# Patient Record
Sex: Female | Born: 1960 | Race: White | Hispanic: No | Marital: Married | Smoking: Never smoker
Health system: Southern US, Community
[De-identification: ages and names within clinical notes are randomized; demographics above are authoritative.]

## PROBLEM LIST (undated history)

## (undated) DIAGNOSIS — I10 Essential (primary) hypertension: Secondary | ICD-10-CM

## (undated) DIAGNOSIS — E7800 Pure hypercholesterolemia, unspecified: Secondary | ICD-10-CM

## (undated) DIAGNOSIS — K219 Gastro-esophageal reflux disease without esophagitis: Secondary | ICD-10-CM

## (undated) DIAGNOSIS — G43909 Migraine, unspecified, not intractable, without status migrainosus: Secondary | ICD-10-CM

## (undated) DIAGNOSIS — K760 Fatty (change of) liver, not elsewhere classified: Secondary | ICD-10-CM

## (undated) HISTORY — PX: BREAST BIOPSY: SHX20

## (undated) HISTORY — PX: HEMORRHOID SURGERY: SHX153

## (undated) HISTORY — PX: ENDOMETRIAL BIOPSY: SHX622

## (undated) HISTORY — PX: CERVICAL BIOPSY  W/ LOOP ELECTRODE EXCISION: SUR135

---

## 2000-01-17 ENCOUNTER — Encounter: Payer: Self-pay | Admitting: Obstetrics and Gynecology

## 2000-01-17 ENCOUNTER — Encounter: Admission: RE | Admit: 2000-01-17 | Discharge: 2000-01-17 | Payer: Self-pay | Admitting: Obstetrics and Gynecology

## 2000-02-02 ENCOUNTER — Encounter: Payer: Self-pay | Admitting: Obstetrics and Gynecology

## 2000-02-02 ENCOUNTER — Encounter: Admission: RE | Admit: 2000-02-02 | Discharge: 2000-02-02 | Payer: Self-pay | Admitting: Obstetrics and Gynecology

## 2000-03-01 ENCOUNTER — Ambulatory Visit (HOSPITAL_COMMUNITY): Admission: RE | Admit: 2000-03-01 | Discharge: 2000-03-01 | Payer: Self-pay | Admitting: Surgery

## 2000-03-01 ENCOUNTER — Encounter (INDEPENDENT_AMBULATORY_CARE_PROVIDER_SITE_OTHER): Payer: Self-pay

## 2000-03-01 ENCOUNTER — Encounter: Payer: Self-pay | Admitting: Surgery

## 2000-08-27 ENCOUNTER — Encounter: Admission: RE | Admit: 2000-08-27 | Discharge: 2000-08-27 | Payer: Self-pay | Admitting: Surgery

## 2000-08-27 ENCOUNTER — Encounter: Payer: Self-pay | Admitting: Surgery

## 2001-01-21 ENCOUNTER — Encounter: Payer: Self-pay | Admitting: Surgery

## 2001-01-21 ENCOUNTER — Encounter: Admission: RE | Admit: 2001-01-21 | Discharge: 2001-01-21 | Payer: Self-pay | Admitting: Surgery

## 2002-01-26 ENCOUNTER — Encounter: Admission: RE | Admit: 2002-01-26 | Discharge: 2002-01-26 | Payer: Self-pay | Admitting: Obstetrics and Gynecology

## 2002-01-26 ENCOUNTER — Encounter: Payer: Self-pay | Admitting: Obstetrics and Gynecology

## 2003-01-28 ENCOUNTER — Encounter: Admission: RE | Admit: 2003-01-28 | Discharge: 2003-01-28 | Payer: Self-pay | Admitting: Family Medicine

## 2003-01-28 ENCOUNTER — Encounter: Payer: Self-pay | Admitting: Family Medicine

## 2004-03-14 ENCOUNTER — Encounter: Admission: RE | Admit: 2004-03-14 | Discharge: 2004-03-14 | Payer: Self-pay | Admitting: Obstetrics and Gynecology

## 2004-05-26 ENCOUNTER — Encounter (INDEPENDENT_AMBULATORY_CARE_PROVIDER_SITE_OTHER): Payer: Self-pay | Admitting: Specialist

## 2004-05-26 ENCOUNTER — Ambulatory Visit (HOSPITAL_COMMUNITY): Admission: RE | Admit: 2004-05-26 | Discharge: 2004-05-26 | Payer: Self-pay | Admitting: Obstetrics and Gynecology

## 2005-04-30 ENCOUNTER — Encounter: Admission: RE | Admit: 2005-04-30 | Discharge: 2005-04-30 | Payer: Self-pay | Admitting: Obstetrics and Gynecology

## 2005-06-18 ENCOUNTER — Ambulatory Visit: Payer: Self-pay | Admitting: Family Medicine

## 2005-08-27 ENCOUNTER — Ambulatory Visit: Payer: Self-pay | Admitting: Family Medicine

## 2006-06-03 ENCOUNTER — Ambulatory Visit: Payer: Self-pay | Admitting: Family Medicine

## 2006-06-05 ENCOUNTER — Encounter: Admission: RE | Admit: 2006-06-05 | Discharge: 2006-06-05 | Payer: Self-pay | Admitting: Obstetrics and Gynecology

## 2007-01-23 ENCOUNTER — Ambulatory Visit: Payer: Self-pay | Admitting: Family Medicine

## 2007-01-30 ENCOUNTER — Ambulatory Visit: Payer: Self-pay | Admitting: Family Medicine

## 2007-04-14 ENCOUNTER — Ambulatory Visit (HOSPITAL_COMMUNITY): Admission: RE | Admit: 2007-04-14 | Discharge: 2007-04-14 | Payer: Self-pay | Admitting: Obstetrics and Gynecology

## 2007-06-30 ENCOUNTER — Encounter: Admission: RE | Admit: 2007-06-30 | Discharge: 2007-06-30 | Payer: Self-pay | Admitting: Obstetrics and Gynecology

## 2008-02-27 IMAGING — US US TRANSVAGINAL NON-OB
1 series · 14 of 25 positions shown · non-contrast
Comparison: none

CLINICAL DATA: Abnormal uterine bleeding.  
 TRANSABDOMINAL AND TRANSVAGINAL PELVIC ULTRASOUND:
TECHNIQUE: Both transabdominal and transvaginal ultrasound examinations of the pelvis were performed including evaluation of the uterus, ovaries, adnexal regions, and pelvic cul-de-sac.

[Series 1: us transvaginal non-ob · 0.29mm/px · 14 of 48 slices shown]
[im 1/48]
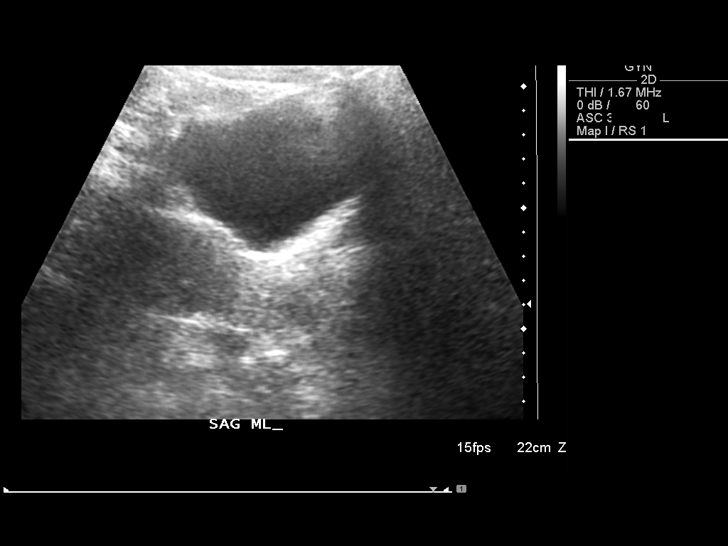
[im 4/48]
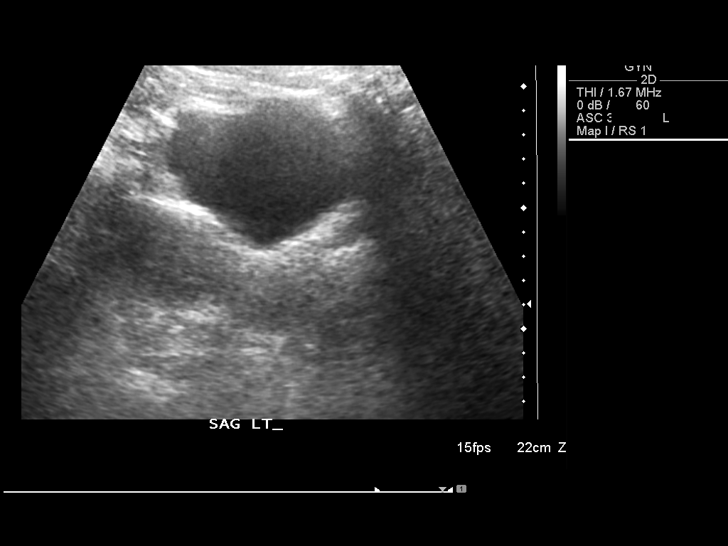
[im 8/48]
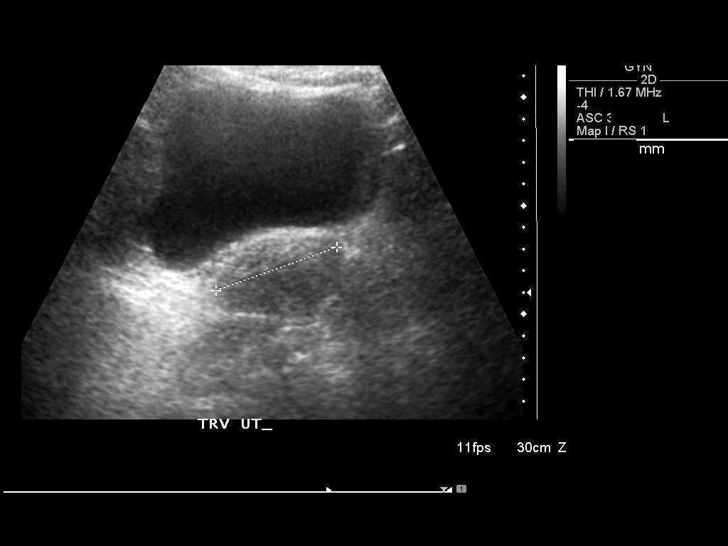
[im 12/48]
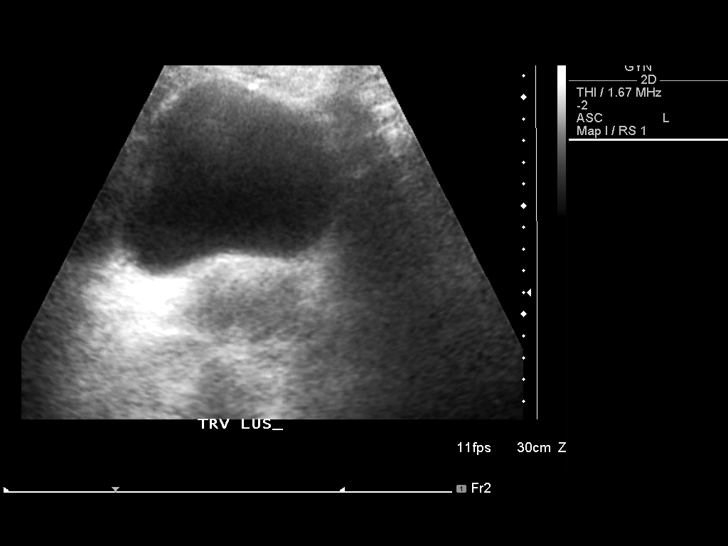
[im 16/48]
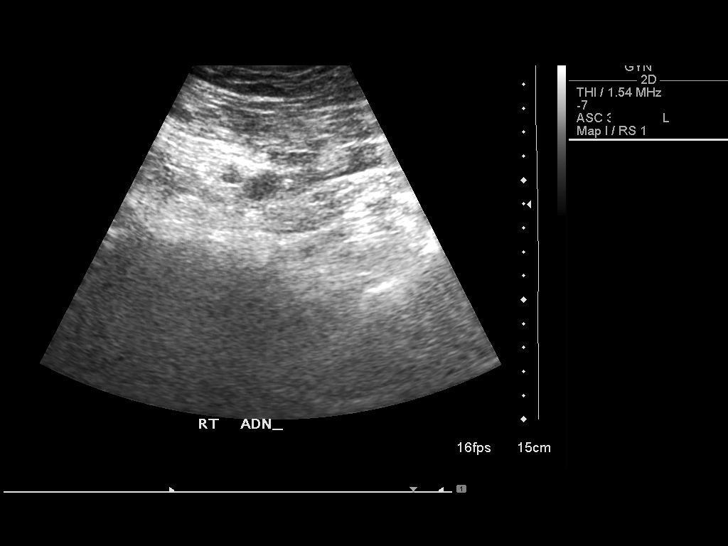
[im 18/48]
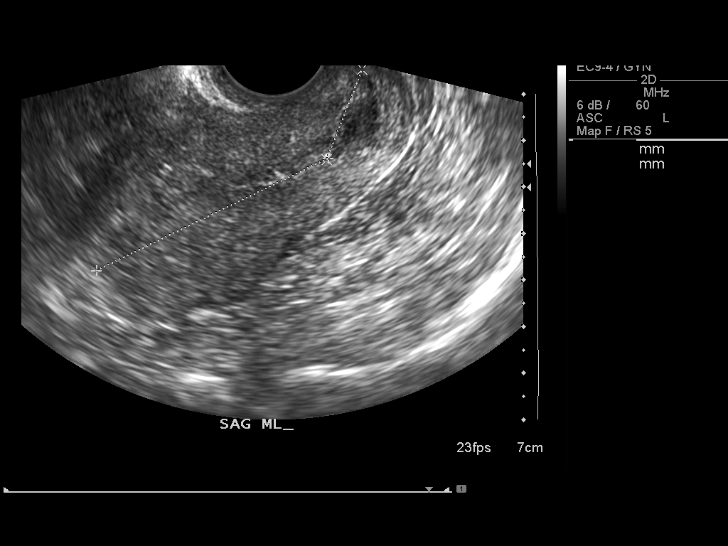
[im 22/48]
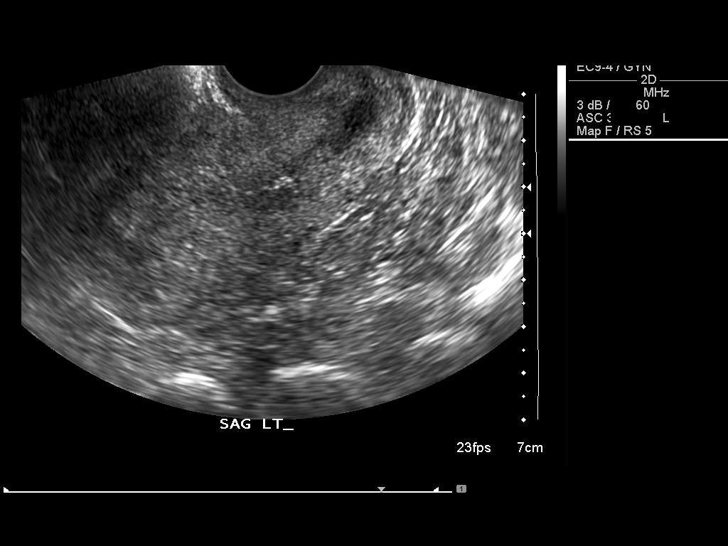
[im 26/48]
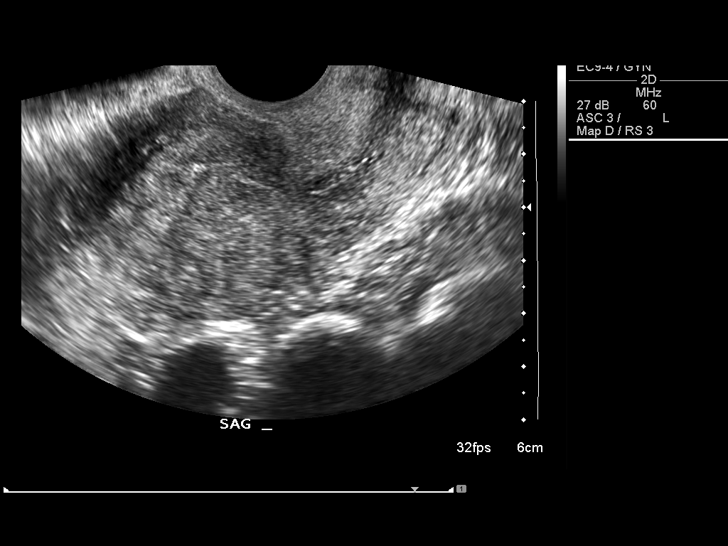
[im 30/48]
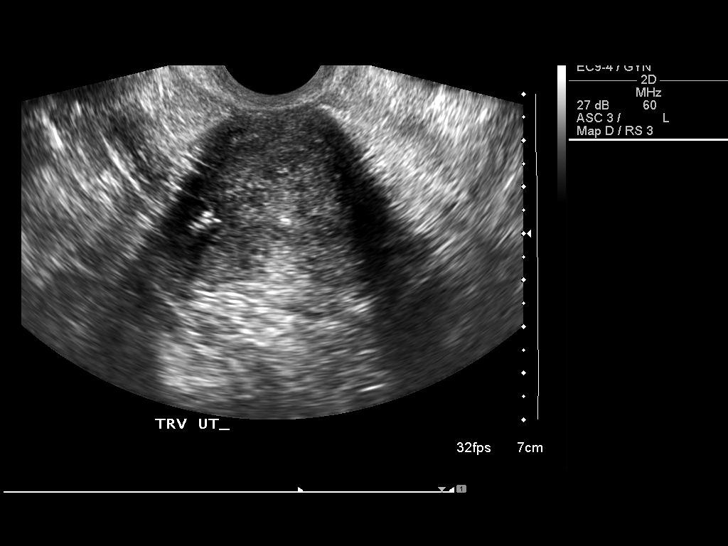
[im 32/48]
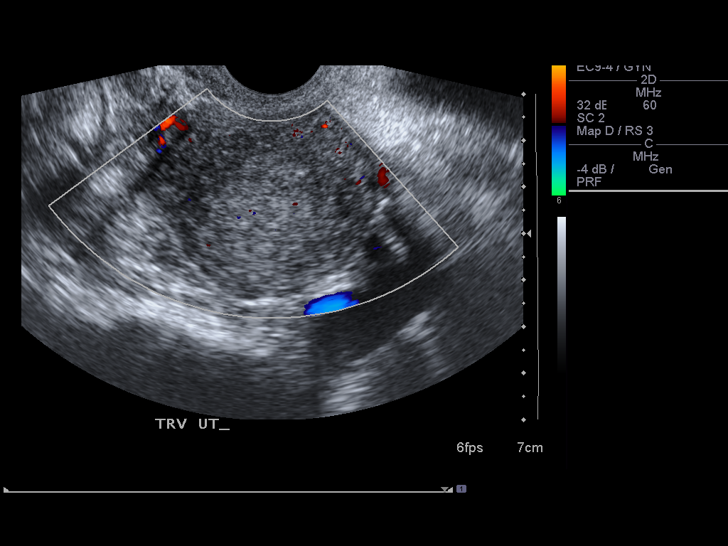
[im 36/48]
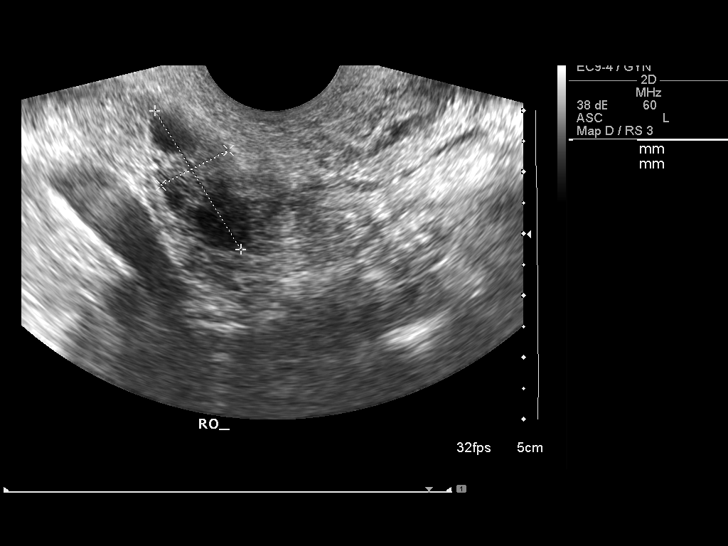
[im 40/48]
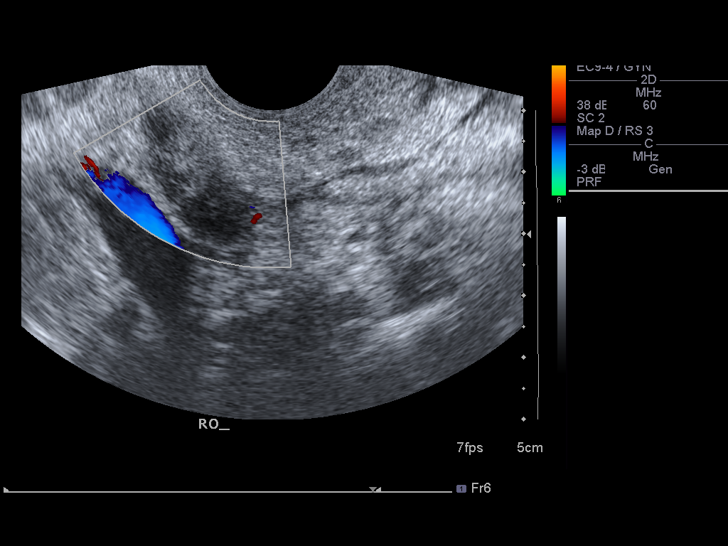
[im 44/48]
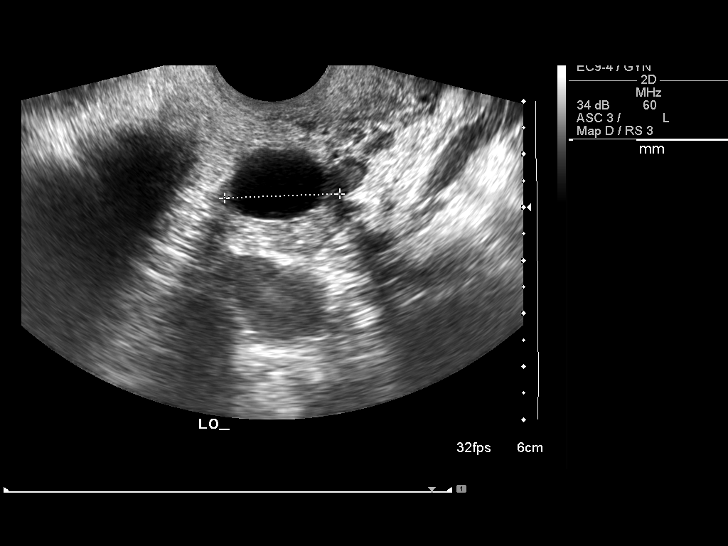
[im 48/48]
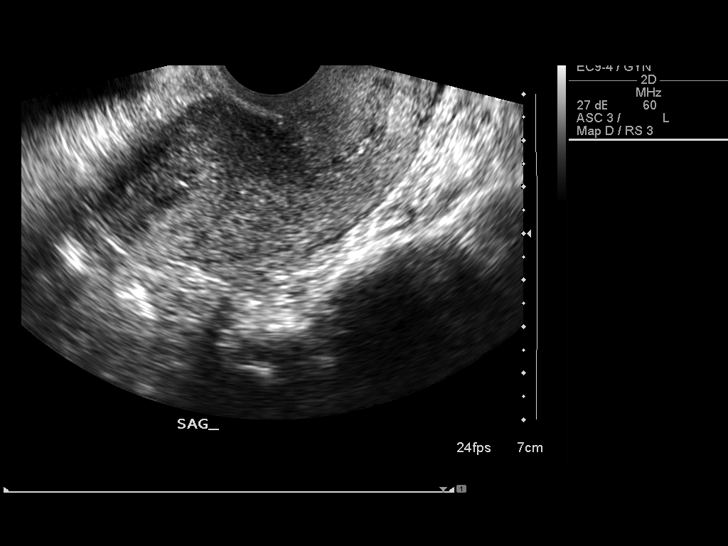

[14 of 25 positions shown; findings below may reference images not displayed]

FINDINGS: The uterus is normal in size measuring approximately 7.5 cm in length.  The uterine myometrium is somewhat inhomogeneous.  A single discrete subserosal myometrial mass is seen in the posterior uterine fundus which measures 1.1 cm in greatest diameter.  Endometrial stripe is unremarkable in appearance and measures 5 mm in thickness on transvaginal sonography. 
 Both ovaries are normal in appearance.  No adnexal masses are identified by transabdominal or transvaginal sonography. There is no evidence of free fluid.
IMPRESSION: 1.  Single tiny subserosal fibroid in the posterior uterine fundus measuring 1.1 cm. 
 2.   Normal ovaries.

## 2008-09-02 ENCOUNTER — Encounter: Admission: RE | Admit: 2008-09-02 | Discharge: 2008-09-02 | Payer: Self-pay | Admitting: Obstetrics and Gynecology

## 2009-11-09 ENCOUNTER — Encounter: Admission: RE | Admit: 2009-11-09 | Discharge: 2009-11-09 | Payer: Self-pay | Admitting: Obstetrics and Gynecology

## 2010-12-07 ENCOUNTER — Encounter
Admission: RE | Admit: 2010-12-07 | Discharge: 2010-12-07 | Payer: Self-pay | Source: Home / Self Care | Attending: Unknown Physician Specialty | Admitting: Unknown Physician Specialty

## 2011-05-04 NOTE — Op Note (Signed)
NAME:  Natalie Garner, Natalie Garner                      ACCOUNT NO.:  [id number]   MEDICAL RECORD NO.:  [id number]                   PATIENT TYPE:  AMB   LOCATION:  DAY                                  FACILITY:  [facility]   PHYSICIAN:  Katherine Roan, M.D.               DATE OF BIRTH:  31-Aug-1961   DATE OF PROCEDURE:  05/26/2004  DATE OF DISCHARGE:                                 OPERATIVE REPORT   PREOPERATIVE DIAGNOSES:  Rule out endocervical adenocarcinoma in situ.   POSTOPERATIVE DIAGNOSES:  Rule out endocervical adenocarcinoma in situ.   OPERATION:  Pelvic exam under anesthesia, cold knife conization,  endocervical curettage.   DESCRIPTION OF PROCEDURE:  The patient was placed in lithotomy position,  examination revealed a parous introitus with a normal size cervix and  uterus.  Prepped and draped in routine fashion, bladder was emptied.  The  cervix was a single tooth tenaculum and injected with 20 mL of 1% Xylocaine  with epinephrine. Following this, I put two lateral hemostatic sutures in  the cervical os and then performed a cold knife conization. An excellent  conization was obtained. The lower segment was then curetted and sent  separately with Telfa. I then used the ball cautery to cauterize the cervix,  hemostasis was secure, no unusual blood loss occurred. Arline Asp was sent to the  recovery room in good condition.                                               Katherine Roan, M.D.    SDM/MEDQ  D:  05/26/2004  T:  05/26/2004  Job:  [id number]

## 2011-11-14 ENCOUNTER — Other Ambulatory Visit: Payer: Self-pay | Admitting: Unknown Physician Specialty

## 2011-11-14 DIAGNOSIS — Z1231 Encounter for screening mammogram for malignant neoplasm of breast: Secondary | ICD-10-CM

## 2011-12-13 ENCOUNTER — Ambulatory Visit
Admission: RE | Admit: 2011-12-13 | Discharge: 2011-12-13 | Disposition: A | Discharge status: Home / Self Care | Payer: BC Managed Care – PPO | Source: Ambulatory Visit | Attending: Unknown Physician Specialty | Admitting: Unknown Physician Specialty

## 2011-12-13 DIAGNOSIS — Z1231 Encounter for screening mammogram for malignant neoplasm of breast: Secondary | ICD-10-CM

## 2012-11-25 ENCOUNTER — Other Ambulatory Visit: Payer: Self-pay | Admitting: Unknown Physician Specialty

## 2012-11-25 DIAGNOSIS — Z1231 Encounter for screening mammogram for malignant neoplasm of breast: Secondary | ICD-10-CM

## 2013-01-02 ENCOUNTER — Ambulatory Visit: Payer: BC Managed Care – PPO

## 2013-01-06 ENCOUNTER — Ambulatory Visit
Admission: RE | Admit: 2013-01-06 | Discharge: 2013-01-06 | Disposition: A | Discharge status: Home / Self Care | Payer: BC Managed Care – PPO | Source: Ambulatory Visit | Attending: Unknown Physician Specialty | Admitting: Unknown Physician Specialty

## 2013-01-06 DIAGNOSIS — Z1231 Encounter for screening mammogram for malignant neoplasm of breast: Secondary | ICD-10-CM

## 2015-01-21 ENCOUNTER — Ambulatory Visit: Payer: Self-pay | Admitting: Physician Assistant

## 2015-03-15 ENCOUNTER — Ambulatory Visit: Payer: Self-pay | Admitting: Emergency Medicine

## 2015-03-15 LAB — PREGNANCY, URINE: Pregnancy Test, Urine: NEGATIVE m[IU]/mL

## 2016-01-28 IMAGING — CR RIGHT HIP - COMPLETE 2+ VIEW
3 series · 3 of 3 positions shown · non-contrast
Comparison: None.

CLINICAL DATA: Injured 2 days ago moving furniture right hip pain
radiating to the right knee

EXAM:
RIGHT HIP (WITH PELVIS) 2-3 VIEWS

[pelvis ap]
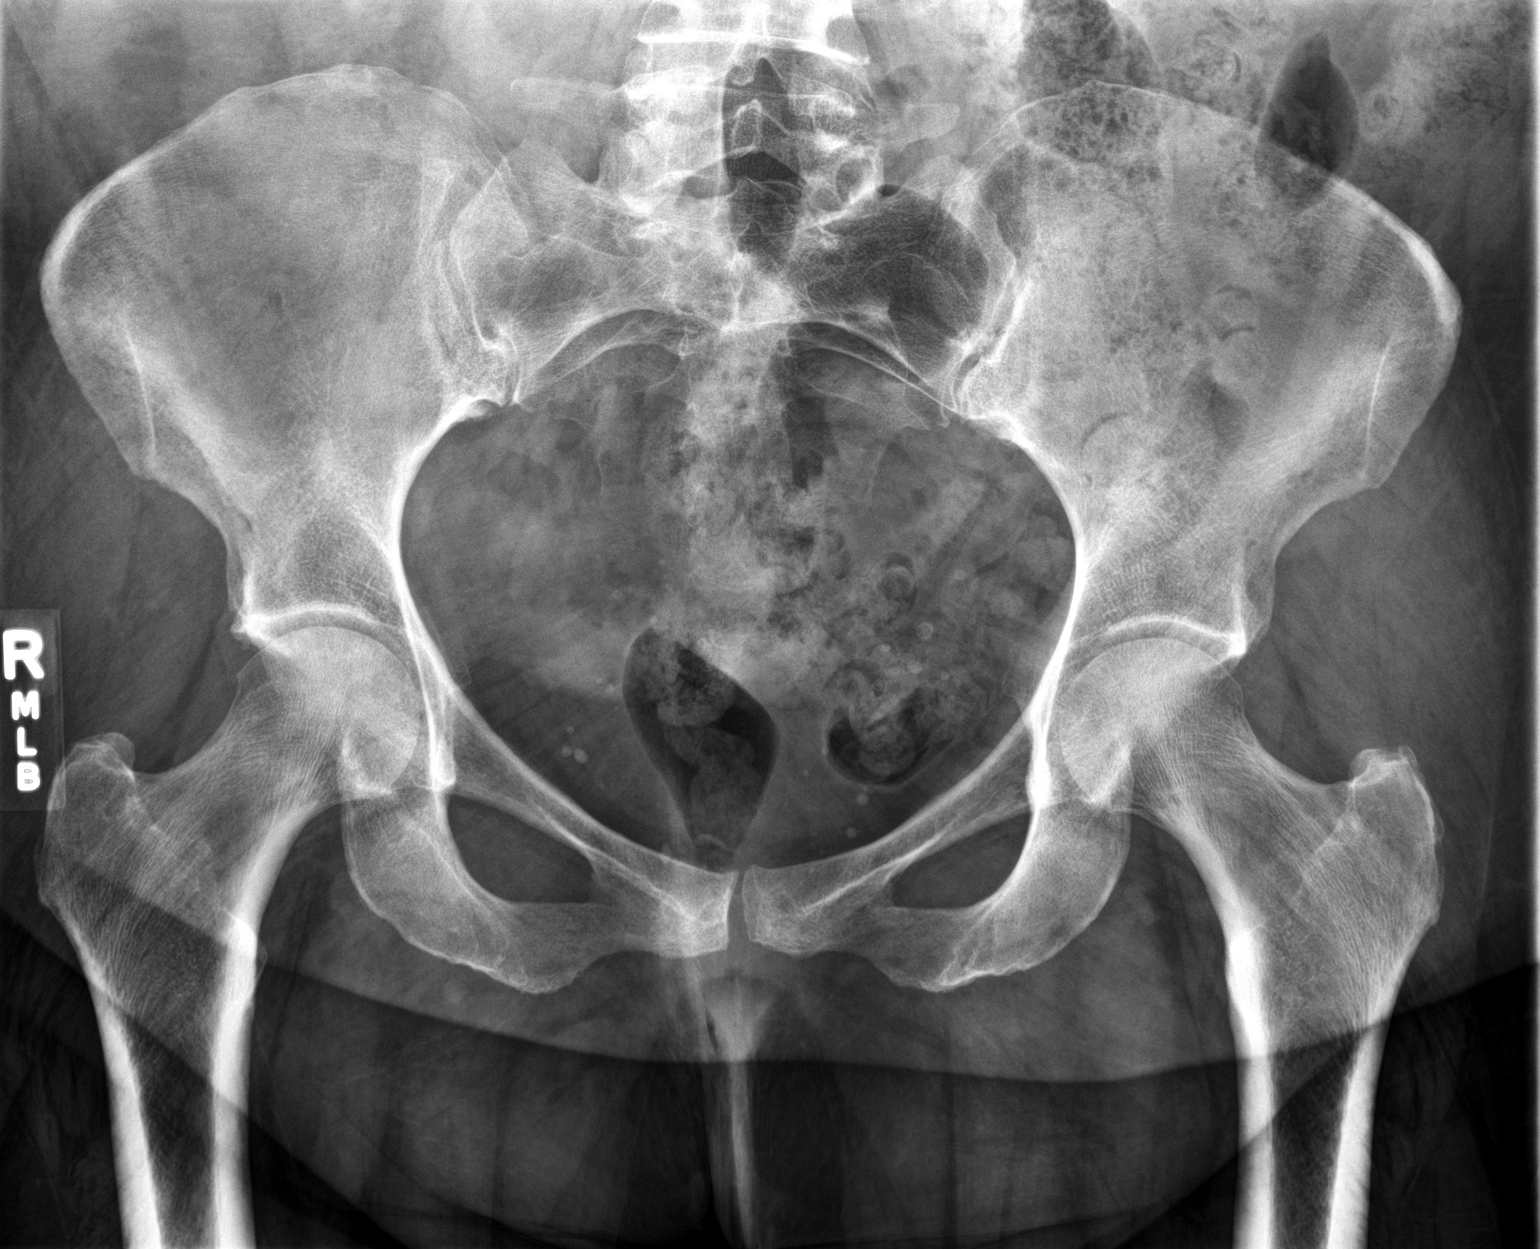

[hip ap]
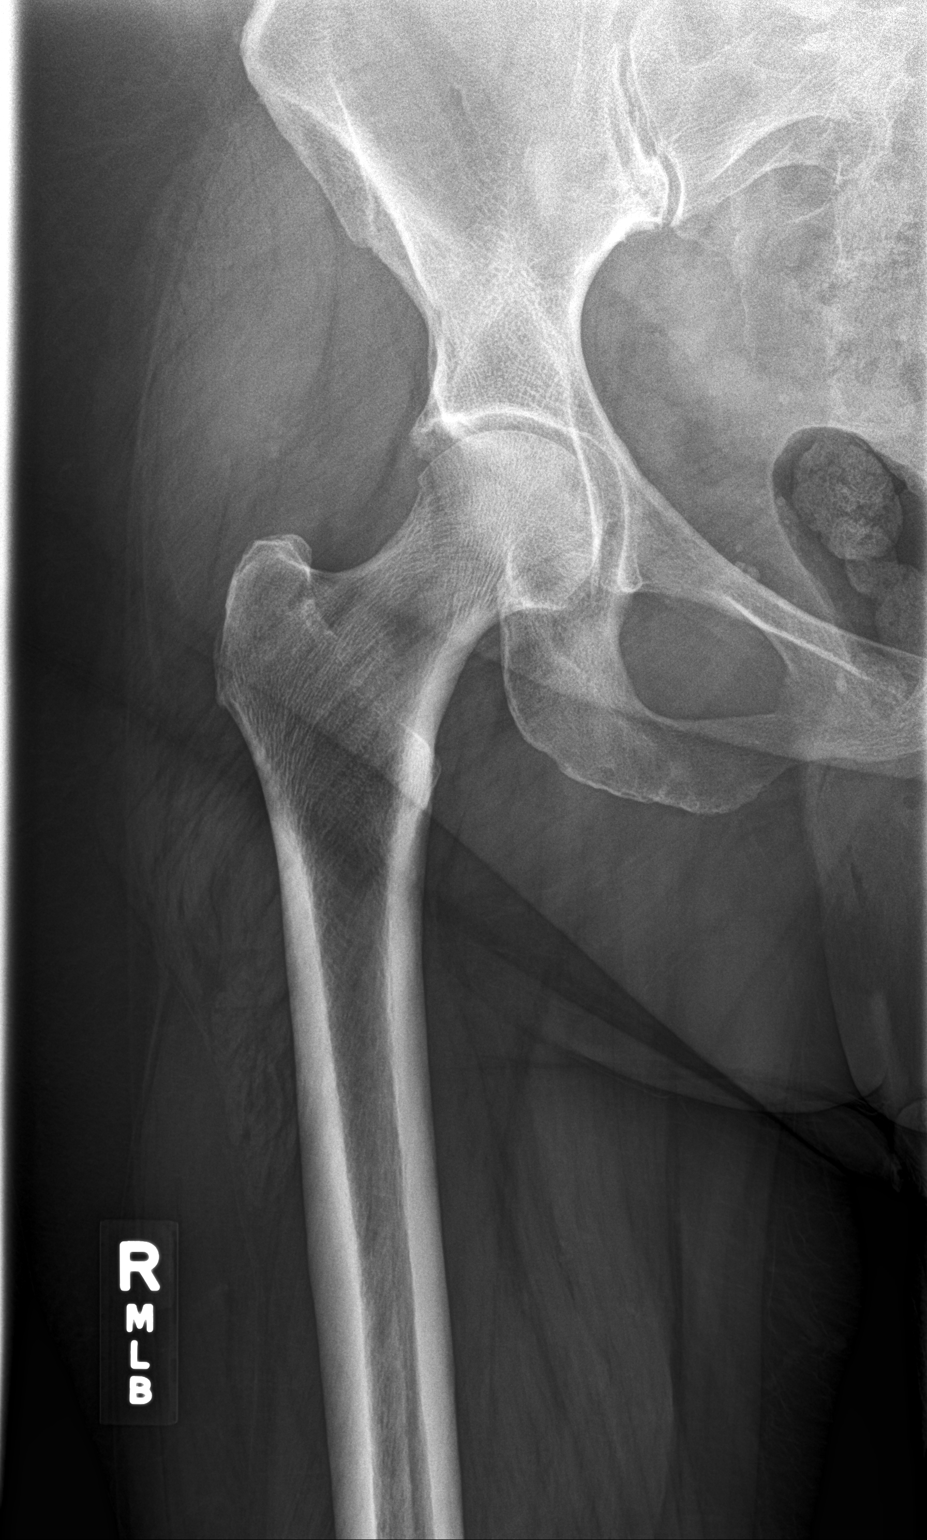

[hip lat]
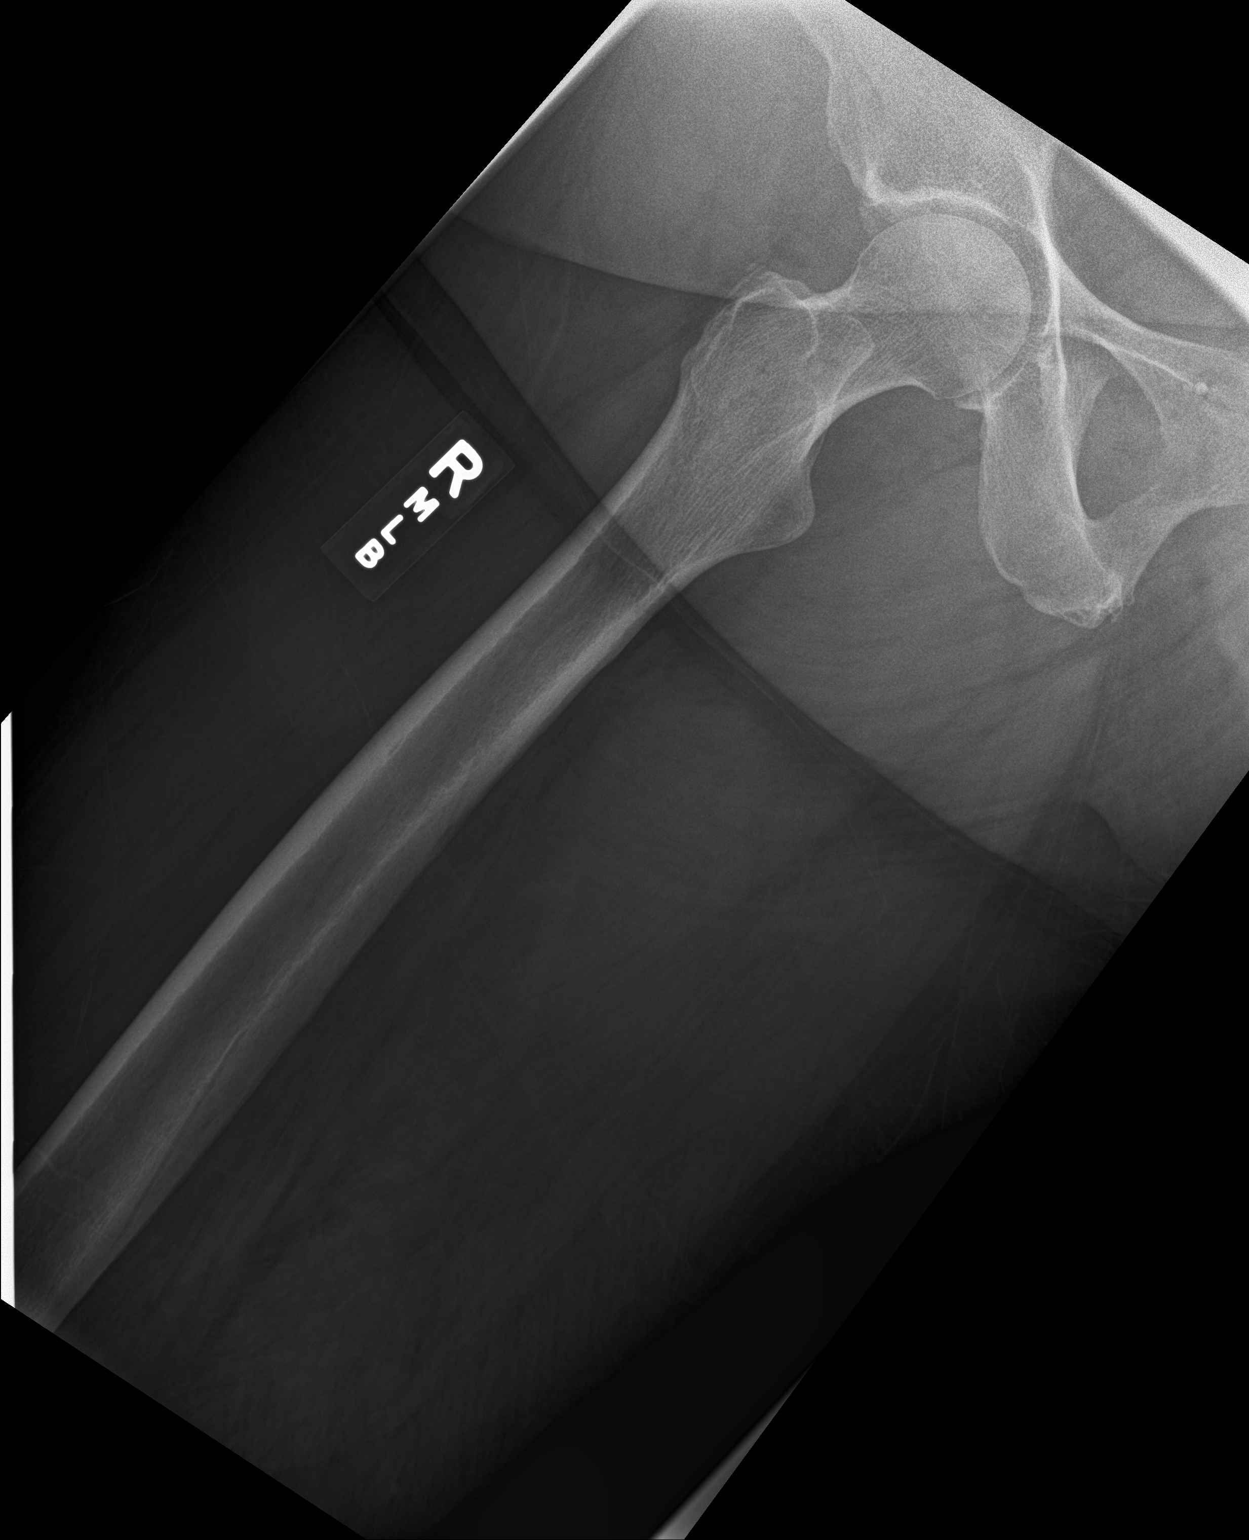

[3 of 3 positions shown; findings below may reference images not displayed]

FINDINGS: No acute fracture is seen. Only mild degenerative joint disease of
the hips is present. The pelvic rami are intact. The SI joints are
corticated.
IMPRESSION: Mild degenerative change of the hips.  No acute abnormality.

## 2017-09-18 ENCOUNTER — Ambulatory Visit
Admission: EM | Admit: 2017-09-18 | Discharge: 2017-09-18 | Disposition: A | Discharge status: Home / Self Care | Payer: BC Managed Care – PPO | Attending: Family Medicine | Admitting: Family Medicine

## 2017-09-18 ENCOUNTER — Encounter: Payer: Self-pay | Admitting: *Deleted

## 2017-09-18 ENCOUNTER — Telehealth: Payer: Self-pay | Admitting: Emergency Medicine

## 2017-09-18 DIAGNOSIS — R51 Headache: Secondary | ICD-10-CM

## 2017-09-18 DIAGNOSIS — R110 Nausea: Secondary | ICD-10-CM

## 2017-09-18 DIAGNOSIS — R197 Diarrhea, unspecified: Secondary | ICD-10-CM

## 2017-09-18 HISTORY — DX: Pure hypercholesterolemia, unspecified: E78.00

## 2017-09-18 HISTORY — DX: Migraine, unspecified, not intractable, without status migrainosus: G43.909

## 2017-09-18 LAB — GASTROINTESTINAL PANEL BY PCR, STOOL (REPLACES STOOL CULTURE)
ASTROVIRUS: NOT DETECTED
Adenovirus F40/41: NOT DETECTED
Campylobacter species: NOT DETECTED
Cryptosporidium: NOT DETECTED
Cyclospora cayetanensis: NOT DETECTED
ENTEROAGGREGATIVE E COLI (EAEC): NOT DETECTED
ENTEROTOXIGENIC E COLI (ETEC): NOT DETECTED
Entamoeba histolytica: NOT DETECTED
Enteropathogenic E coli (EPEC): NOT DETECTED
GIARDIA LAMBLIA: NOT DETECTED
NOROVIRUS GI/GII: NOT DETECTED
Plesimonas shigelloides: NOT DETECTED
ROTAVIRUS A: NOT DETECTED
SALMONELLA SPECIES: NOT DETECTED
SHIGA LIKE TOXIN PRODUCING E COLI (STEC): NOT DETECTED
SHIGELLA/ENTEROINVASIVE E COLI (EIEC): NOT DETECTED
Sapovirus (I, II, IV, and V): NOT DETECTED
VIBRIO CHOLERAE: NOT DETECTED
Vibrio species: NOT DETECTED
Yersinia enterocolitica: NOT DETECTED

## 2017-09-18 LAB — URINALYSIS, COMPLETE (UACMP) WITH MICROSCOPIC
BILIRUBIN URINE: NEGATIVE
GLUCOSE, UA: NEGATIVE mg/dL
Hgb urine dipstick: NEGATIVE
KETONES UR: NEGATIVE mg/dL
Nitrite: NEGATIVE
PROTEIN: NEGATIVE mg/dL
Specific Gravity, Urine: 1.02 (ref 1.005–1.030)
pH: 7 (ref 5.0–8.0)

## 2017-09-18 LAB — CLOSTRIDIUM DIFFICILE BY PCR: Toxigenic C. Difficile by PCR: NEGATIVE

## 2017-09-18 LAB — BASIC METABOLIC PANEL
Anion gap: 9 (ref 5–15)
BUN: 18 mg/dL (ref 6–20)
CALCIUM: 8.8 mg/dL — AB (ref 8.9–10.3)
CO2: 26 mmol/L (ref 22–32)
CREATININE: 0.53 mg/dL (ref 0.44–1.00)
Chloride: 103 mmol/L (ref 101–111)
GFR calc Af Amer: >60 mL/min (ref >60)
GFR calc non Af Amer: >60 mL/min (ref >60)
Glucose, Bld: 108 mg/dL — ABNORMAL HIGH (ref 65–99)
Potassium: 3.7 mmol/L (ref 3.5–5.1)
SODIUM: 138 mmol/L (ref 135–145)

## 2017-09-18 LAB — CBC WITH DIFFERENTIAL/PLATELET
BASOS PCT: 1 %
Basophils Absolute: 0.1 10*3/uL (ref 0–0.1)
EOS ABS: 0.1 10*3/uL (ref 0–0.7)
EOS PCT: 1 %
HCT: 40.4 % (ref 35.0–47.0)
Hemoglobin: 14 g/dL (ref 12.0–16.0)
LYMPHS ABS: 3.1 10*3/uL (ref 1.0–3.6)
Lymphocytes Relative: 39 %
MCH: 30.5 pg (ref 26.0–34.0)
MCHC: 34.7 g/dL (ref 32.0–36.0)
MCV: 88 fL (ref 80.0–100.0)
MONOS PCT: 6 %
Monocytes Absolute: 0.5 10*3/uL (ref 0.2–0.9)
Neutro Abs: 4.1 10*3/uL (ref 1.4–6.5)
Neutrophils Relative %: 53 %
PLATELETS: 231 10*3/uL (ref 150–440)
RBC: 4.59 MIL/uL (ref 3.80–5.20)
RDW: 12.8 % (ref 11.5–14.5)
WBC: 7.9 10*3/uL (ref 3.6–11.0)

## 2017-09-18 LAB — LIPASE, BLOOD: Lipase: 25 U/L (ref 11–51)

## 2017-09-18 LAB — C DIFFICILE QUICK SCREEN W PCR REFLEX
C DIFFICILE (CDIFF) TOXIN: NEGATIVE
C DIFFICLE (CDIFF) ANTIGEN: POSITIVE — AB

## 2017-09-18 MED ORDER — METRONIDAZOLE 500 MG PO TABS
500.0000 mg | ORAL_TABLET | Freq: Three times a day (TID) | ORAL | 0 refills | Status: AC
Start: 2017-09-18 — End: 2017-09-28

## 2017-09-18 MED ORDER — ONDANSETRON 4 MG PO TBDP: 4.0000 mg | ORAL_TABLET | Freq: Three times a day (TID) | ORAL | 0 refills | Status: DC | PRN

## 2017-09-18 MED ORDER — ONDANSETRON 8 MG PO TBDP
8.0000 mg | ORAL_TABLET | Freq: Once | ORAL | Status: AC
  Administered 2017-09-18: 8 mg via ORAL

## 2017-09-18 NOTE — Discharge Instructions (Signed)
Take medication as prescribed. Rest. Drink plenty of fluids.   Follow up with your primary care physician this week as needed. Return to Urgent care or Emergency room for fevers, abdominal pain, new or worsening concerns.

## 2017-09-18 NOTE — ED Triage Notes (Signed)
Pt was at a potluck on Sunday and after that she started feeling sick. Diarrhea, stomach pain, headache, and blisters to her mourh

## 2017-09-18 NOTE — Telephone Encounter (Signed)
GI panel PCR and c.diff reviewed and called and discussed with patient. Concern for active c.diff as antigen positive, toxin negative. Will treat patient with oral flagyl TID x 10 days. Discussed stopping imodium. Encouraged close PCP or GI follow up this week. Patient was scheduled to have colonoscopy this month, recommended to call and inform and reschedule. Discussed strict follow up and return parameters.

## 2017-09-18 NOTE — ED Provider Notes (Addendum)
[facility] ____________________________________________  Time seen: Approximately 12:05 PM  I have reviewed the triage vital signs and the nursing notes.   HISTORY  Chief Complaint Headache; GI Problem; and Diarrhea   HPI Natalie Garner is a 56 y.o. female  presenting for evaluation of nausea and diarrhea. Patient reports this past Friday night she eat at a later date event, and reports later that night she woke up with having multiple episodes of diarrhea. Patient reports accompanying nausea, no vomiting. States diarrhea has continued though slightly improved. Reports yesterday approximately 6 or 7 episodes of diarrhea, 3 so far today. Reports unresolved with over-the-counter Imodium. States diffuse abdominal discomfort, stating discomfort is worse after eating and she can feel the food moving all the way through her stomach. Denies any point abdominal pain or persistent area of pain. Denies accompanying fevers. States no dysuria, but has noticed urine appeared cloudy, history of UTIs. Denies melena, hematochezia or abnormal colored stool. States does have hemorrhoids unchanged for many years, and denies any changes with her hemorrhoid pain. Denies other recent sickness. states again discomfort is worse with actively eating. Has been jerking water and eating some but not eating as much. Denies other aggravating or alleviating factors. Denies others at home with same, however reports that she has heard multiple others that ate the same food has had similar.Denies chest pain, shortness of breath, extremity pain, extremity swelling or rash. Denies recent sickness. Denies recent antibiotic use.   Manus Rudd, MD: PCP   Past Medical History:  Diagnosis Date  . High cholesterol   . Migraine     There are no active problems to display for this patient.   No past surgical history on file.   No current facility-administered medications for this encounter.    Current Outpatient Prescriptions:  .  atenolol (TENORMIN) 50 MG tablet, Take 50 mg by mouth daily., Disp: , Rfl:  .  hydrochlorothiazide (HYDRODIURIL) 25 MG tablet, Take 25 mg by mouth daily., Disp: , Rfl:  .  lansoprazole (PREVACID) 30 MG capsule, Take 30 mg by mouth daily at 12 noon., Disp: , Rfl:  .  meloxicam (MOBIC) 7.5 MG tablet, Take 7.5 mg by mouth daily., Disp: , Rfl:  .  simvastatin (ZOCOR) 40 MG tablet, Take 40 mg by mouth daily., Disp: , Rfl:  .  ondansetron (ZOFRAN ODT) 4 MG disintegrating tablet, Take 1 tablet (4 mg total) by mouth every 8 (eight) hours as needed for nausea or vomiting., Disp: 15 tablet, Rfl: 0  Allergies Patient has no allergy information on record.  family history Denies others at home with similar complaints.  Social History Social History  Substance Use Topics  . Smoking status: Never Smoker  . Smokeless tobacco: Never Used  . Alcohol use Yes     Comment: occasionaly    Review of Systems Constitutional: No fever/chills Eyes: No visual changes. ENT: No sore throat. Cardiovascular: Denies chest pain. Respiratory: Denies shortness of breath. Gastrointestinal: AS above.  Genitourinary: AS above. Musculoskeletal: Negative for back pain. Skin: Negative for rash. Neurological: Negative for focal weakness or numbness. .  ____________________________________________   PHYSICAL EXAM:  VITAL SIGNS: ED Triage Vitals  Enc Vitals Group     BP 09/18/17 1153 118/70     Pulse Rate 09/18/17 1153 85     Resp 09/18/17 1153 12     Temp 09/18/17 1153 98.3 F (36.8 C)     Temp Source 09/18/17 1153 Oral     SpO2 09/18/17 1153  93 %     Weight 09/18/17 1147 270 lb (122.5 kg)     Height 09/18/17 1147 5\' 6"  (1.676 m)     Head Circumference --      Peak Flow --      Pain Score 09/18/17 1147 9     Pain Loc --      Pain Edu? --      Excl. in [place]? --     Constitutional: Alert and oriented. Well appearing and in no acute distress. Eyes: Conjunctivae  are normal.  ENT      Head: Normocephalic and atraumatic.      Nose: No congestion/rhinnorhea.      Mouth/Throat: Mucous membranes are moist.Oropharynx non-erythematous.Right lateral lip chapped area of skin, skin intact. Cardiovascular: Normal rate, regular rhythm. Grossly normal heart sounds.  Good peripheral circulation. Respiratory: Normal respiratory effort without tachypnea nor retractions. Breath sounds are clear and equal bilaterally. No wheezes, rales, rhonchi. Gastrointestinal: Obese abdomen. Mild diffuse tenderness. No point tenderness. Normal Bowel sounds. No CVA tenderness. Musculoskeletal:   No midline cervical, thoracic or lumbar tenderness to palpation.  Steady gait. Neurologic:  Normal speech and language. No gross focal neurologic deficits are appreciated. Speech is normal. No gait instability.  Skin:  Skin is warm, dry and intact. No rash noted. Psychiatric: Mood and affect are normal. Speech and behavior are normal. Patient exhibits appropriate insight and judgment   ___________________________________________   LABS (all labs ordered are listed, but only abnormal results are displayed)  Labs Reviewed  C DIFFICILE QUICK SCREEN W PCR REFLEX - Abnormal; Notable for the following:       Result Value   C Diff antigen POSITIVE (*)    All other components within normal limits  BASIC METABOLIC PANEL - Abnormal; Notable for the following:    Glucose, Bld 108 (*)    Calcium 8.8 (*)    All other components within normal limits  URINALYSIS, COMPLETE (UACMP) WITH MICROSCOPIC - Abnormal; Notable for the following:    Leukocytes, UA SMALL (*)    Squamous Epithelial / LPF 6-30 (*)    Bacteria, UA FEW (*)    All other components within normal limits  GASTROINTESTINAL PANEL BY PCR, STOOL (REPLACES STOOL CULTURE)  URINE CULTURE  CLOSTRIDIUM DIFFICILE BY PCR  CBC WITH DIFFERENTIAL/PLATELET  LIPASE, BLOOD   ____________________________________________  RADIOLOGY  No  results found. ____________________________________________   PROCEDURES Procedures    INITIAL IMPRESSION / ASSESSMENT AND PLAN / ED COURSE  Pertinent labs & imaging results that were available during my care of the patient were reviewed by me and considered in my medical decision making (see chart for details).   Well-appearing patient. No acute distress. 8 mg of ODT Zofran given in urgent care. Discussed in detail with patient, will evaluate CBC, BMP, lipase, urine and GI panel.  After Zofran, patient reports overall she is feeling better. Patient states that she is now hungry and ready to try to eat. Labs reviewed and discussed in detail with patient. The urine not clear UTI, will culture. Patient denies any fever accompanying symptoms or any bloody stool. Discussed with patient empiric use of Cipro for concern of infectious diarrhea as well as urine, however patient does report multiple recent tendon issues. Also discussed use of azithromycin, however patient states and we agreed to await the urine culture as well as GI panel prior to initiation of antibiotic use. Supportive treatment with when necessary Zofran. Encouraged rest, fluids and BRAT diet. Strict follow-up and  return parameters discussed including proceeding to the emergency room for increased pain, focal pain or worsening concerns.Discussed indication, risks and benefits of medications with patient.  Discussed follow up with Primary care physician this week. Discussed follow up and return parameters including no resolution or any worsening concerns. Patient verbalized understanding and agreed to plan.   ____________________________________________   FINAL CLINICAL IMPRESSION(S) / ED DIAGNOSES  Final diagnoses:  Diarrhea, unspecified type  Nausea without vomiting     Discharge Medication List as of 09/18/2017  1:37 PM    START taking these medications   Details  ondansetron (ZOFRAN ODT) 4 MG disintegrating tablet Take  1 tablet (4 mg total) by mouth every 8 (eight) hours as needed for nausea or vomiting., Starting Wed 09/18/2017, Normal        Note: This dictation was prepared with Dragon dictation along with smaller phrase technology. Any transcriptional errors that result from this process are unintentional.         Marylene Land, NP 09/18/17 1556    Marylene Land, NP 09/18/17 1557

## 2017-09-20 ENCOUNTER — Telehealth: Payer: Self-pay | Admitting: Emergency Medicine

## 2017-09-20 LAB — URINE CULTURE

## 2017-10-03 ENCOUNTER — Encounter: Payer: Self-pay | Admitting: *Deleted

## 2017-10-04 ENCOUNTER — Encounter: Payer: Self-pay | Admitting: *Deleted

## 2017-10-04 ENCOUNTER — Ambulatory Visit: Payer: BC Managed Care – PPO | Admitting: Registered Nurse

## 2017-10-04 ENCOUNTER — Encounter
Admission: RE | Disposition: A | Discharge status: Home / Self Care | Payer: Self-pay | Source: Ambulatory Visit | Attending: Unknown Physician Specialty

## 2017-10-04 ENCOUNTER — Ambulatory Visit
Admission: RE | Admit: 2017-10-04 | Discharge: 2017-10-04 | Disposition: A | Discharge status: Home / Self Care | Payer: BC Managed Care – PPO | Source: Ambulatory Visit | Attending: Unknown Physician Specialty | Admitting: Unknown Physician Specialty

## 2017-10-04 DIAGNOSIS — E7800 Pure hypercholesterolemia, unspecified: Secondary | ICD-10-CM | POA: Insufficient documentation

## 2017-10-04 DIAGNOSIS — I10 Essential (primary) hypertension: Secondary | ICD-10-CM | POA: Insufficient documentation

## 2017-10-04 DIAGNOSIS — K219 Gastro-esophageal reflux disease without esophagitis: Secondary | ICD-10-CM | POA: Insufficient documentation

## 2017-10-04 DIAGNOSIS — D125 Benign neoplasm of sigmoid colon: Secondary | ICD-10-CM | POA: Diagnosis not present

## 2017-10-04 DIAGNOSIS — K644 Residual hemorrhoidal skin tags: Secondary | ICD-10-CM | POA: Insufficient documentation

## 2017-10-04 DIAGNOSIS — D122 Benign neoplasm of ascending colon: Secondary | ICD-10-CM | POA: Insufficient documentation

## 2017-10-04 DIAGNOSIS — Z6841 Body Mass Index (BMI) 40.0 and over, adult: Secondary | ICD-10-CM | POA: Diagnosis not present

## 2017-10-04 DIAGNOSIS — Z1211 Encounter for screening for malignant neoplasm of colon: Secondary | ICD-10-CM | POA: Diagnosis not present

## 2017-10-04 DIAGNOSIS — K640 First degree hemorrhoids: Secondary | ICD-10-CM | POA: Diagnosis not present

## 2017-10-04 DIAGNOSIS — K5730 Diverticulosis of large intestine without perforation or abscess without bleeding: Secondary | ICD-10-CM | POA: Diagnosis not present

## 2017-10-04 DIAGNOSIS — Z79899 Other long term (current) drug therapy: Secondary | ICD-10-CM | POA: Insufficient documentation

## 2017-10-04 HISTORY — DX: Fatty (change of) liver, not elsewhere classified: K76.0

## 2017-10-04 HISTORY — DX: Essential (primary) hypertension: I10

## 2017-10-04 HISTORY — PX: COLONOSCOPY WITH PROPOFOL: SHX5780

## 2017-10-04 HISTORY — DX: Gastro-esophageal reflux disease without esophagitis: K21.9

## 2017-10-04 SURGERY — COLONOSCOPY WITH PROPOFOL
Anesthesia: General

## 2017-10-04 MED ORDER — MIDAZOLAM HCL 2 MG/2ML IJ SOLN
INTRAMUSCULAR | Status: DC | PRN
  Administered 2017-10-04 (×2): 1 mg via INTRAVENOUS

## 2017-10-04 MED ORDER — PROPOFOL 500 MG/50ML IV EMUL
INTRAVENOUS | Status: DC | PRN
  Administered 2017-10-04: 140 ug/kg/min via INTRAVENOUS

## 2017-10-04 MED ORDER — SODIUM CHLORIDE 0.9 % IV SOLN: INTRAVENOUS | Status: DC

## 2017-10-04 MED ORDER — SODIUM CHLORIDE 0.9 % IV SOLN
INTRAVENOUS | Status: DC
Start: 2017-10-04 — End: 2017-10-04
  Administered 2017-10-04: 10:00:00 via INTRAVENOUS

## 2017-10-04 MED ORDER — PROPOFOL 10 MG/ML IV BOLUS
INTRAVENOUS | Status: DC | PRN
  Administered 2017-10-04: 10 mg via INTRAVENOUS
  Administered 2017-10-04: 80 mg via INTRAVENOUS
  Administered 2017-10-04: 20 mg via INTRAVENOUS
  Administered 2017-10-04: 30 mg via INTRAVENOUS

## 2017-10-04 NOTE — Anesthesia Post-op Follow-up Note (Signed)
Anesthesia QCDR form completed.        

## 2017-10-04 NOTE — Op Note (Signed)
[facility] Gastroenterology Patient Name: Natalie Garner Procedure Date: 10/04/2017 9:51 AM MRN: [id number] Account #: [id number] Date of Birth: [birth year] Admit Type: Outpatient Age: 56 Room: [facility] ENDO ROOM 1 Gender: Female Note Status: Finalized Procedure:            Colonoscopy Indications:          Screening for colorectal malignant neoplasm Providers:            Manya Silvas, MD Medicines:            Propofol per Anesthesia Complications:        No immediate complications. Procedure:            Pre-Anesthesia Assessment:                       - After reviewing the risks and benefits, the patient                        was deemed in satisfactory condition to undergo the                        procedure.                       After obtaining informed consent, the colonoscope was                        passed under direct vision. Throughout the procedure,                        the patient's blood pressure, pulse, and oxygen                        saturations were monitored continuously. The                        Colonoscope was introduced through the anus and                        advanced to the the cecum, identified by appendiceal                        orifice and ileocecal valve. The colonoscopy was                        somewhat difficult. The patient tolerated the procedure                        well. The quality of the bowel preparation was good. Findings:      A medium large polyp was found in the ascending colon. The polyp was       sessile. The polyp was removed with a hot snare. Resection and retrieval       were complete. To prevent bleeding after the polypectomy, five       hemostatic clips were successfully placed. There was no bleeding during,       or at the end, of the procedure.      A diminutive polyp was found in the sigmoid colon. The polyp was       sessile. The polyp was removed with a hot snare. Resection and retrieval     were complete.  Multiple small-mouthed diverticula were found in the sigmoid colon and       descending colon.      Internal hemorrhoids were found during endoscopy. The hemorrhoids were       medium-sized, large and Grade I (internal hemorrhoids that do not       prolapse).      The exam was otherwise without abnormality. Impression:           - One large polyp in the ascending colon, removed with                        a hot snare. Resected and retrieved. Clips were placed.                       - One diminutive polyp in the sigmoid colon, removed                        with a hot snare. Resected and retrieved.                       - Diverticulosis in the sigmoid colon and in the                        descending colon.                       - Internal hemorrhoids. Recommendation:       - Await pathology results. Manya Silvas, MD 10/04/2017 10:45:14 AM This report has been signed electronically. Number of Addenda: 0 Note Initiated On: 10/04/2017 9:51 AM Scope Withdrawal Time: 0 hours 27 minutes 32 seconds  Total Procedure Duration: 0 hours 37 minutes 40 seconds       [facility]

## 2017-10-04 NOTE — Anesthesia Preprocedure Evaluation (Signed)
Anesthesia Evaluation  Patient identified by MRN, date of birth, ID band Patient awake    Reviewed: Allergy & Precautions, H&P , NPO status , Patient's Chart, lab work & pertinent test results, reviewed documented beta blocker date and time   History of Anesthesia Complications Negative for: history of anesthetic complications  Airway Mallampati: I  TM Distance: >3 FB Neck ROM: full    Dental  (+) Caps, Dental Advidsory Given, Teeth Intact   Pulmonary neg pulmonary ROS,           Cardiovascular Exercise Tolerance: Good hypertension, (-) angina(-) CAD, (-) Past MI, (-) Cardiac Stents and (-) CABG (-) dysrhythmias (-) Valvular Problems/Murmurs     Neuro/Psych negative neurological ROS  negative psych ROS   GI/Hepatic GERD  ,NAFLD   Endo/Other  neg diabetesMorbid obesity  Renal/GU negative Renal ROS  negative genitourinary   Musculoskeletal   Abdominal   Peds  Hematology negative hematology ROS (+)   Anesthesia Other Findings Past Medical History: No date: Fatty liver No date: GERD (gastroesophageal reflux disease) No date: High cholesterol No date: Hypertension No date: Migraine   Reproductive/Obstetrics negative OB ROS                             Anesthesia Physical Anesthesia Plan  ASA: III  Anesthesia Plan: General   Post-op Pain Management:    Induction: Intravenous  PONV Risk Score and Plan: 3 and Propofol infusion  Airway Management Planned: Nasal Cannula  Additional Equipment:   Intra-op Plan:   Post-operative Plan:   Informed Consent: I have reviewed the patients History and Physical, chart, labs and discussed the procedure including the risks, benefits and alternatives for the proposed anesthesia with the patient or authorized representative who has indicated his/her understanding and acceptance.   Dental Advisory Given  Plan Discussed with: Anesthesiologist,  CRNA and Surgeon  Anesthesia Plan Comments:         Anesthesia Quick Evaluation

## 2017-10-04 NOTE — H&P (Signed)
Primary Care Physician:  Manus Rudd, MD Primary Gastroenterologist:  Dr. Vira Agar  Pre-Procedure History & Physical: HPI:  Natalie Garner is a 56 y.o. female is here for an colonoscopy.   Past Medical History:  Diagnosis Date  . Fatty liver   . GERD (gastroesophageal reflux disease)   . High cholesterol   . Hypertension   . Migraine     Past Surgical History:  Procedure Laterality Date  . BREAST BIOPSY Left   . CERVICAL BIOPSY  W/ LOOP ELECTRODE EXCISION    . ENDOMETRIAL BIOPSY    . HEMORRHOID SURGERY      Prior to Admission medications   Medication Sig Start Date End Date Taking? Authorizing Provider  BIOTIN PO Take by mouth.   Yes [provider]  Cholecalciferol 1000 units tablet Take 1,000 Units by mouth daily.   Yes [provider]  conjugated estrogens (PREMARIN) vaginal cream Place 1 Applicatorful vaginally daily.   Yes [provider]  lansoprazole (PREVACID) 30 MG capsule Take 30 mg by mouth daily at 12 noon.   Yes [provider]  LORazepam (ATIVAN) 1 MG tablet Take 1 mg by mouth every 8 (eight) hours.   Yes [provider]  valACYclovir (VALTREX) 500 MG tablet Take 500 mg by mouth 2 (two) times daily.   Yes [provider]  atenolol (TENORMIN) 50 MG tablet Take 50 mg by mouth daily.    [provider]  hydrochlorothiazide (HYDRODIURIL) 25 MG tablet Take 25 mg by mouth daily.    [provider]  meloxicam (MOBIC) 7.5 MG tablet Take 7.5 mg by mouth daily.    [provider]  nitrofurantoin, macrocrystal-monohydrate, (MACROBID) 100 MG capsule Take 100 mg by mouth 2 (two) times daily.    [provider]  ondansetron (ZOFRAN ODT) 4 MG disintegrating tablet Take 1 tablet (4 mg total) by mouth every 8 (eight) hours as needed for nausea or vomiting. Patient not taking: Reported on 10/04/2017 09/18/17   Marylene Land, NP  simvastatin (ZOCOR) 40 MG tablet Take 40 mg by  mouth daily.    [provider]    Allergies as of 07/15/2017  . (Not on File)    History reviewed. No pertinent family history.  Social History   Social History  . Marital status: Married    Spouse name: N/A  . Number of children: N/A  . Years of education: N/A   Occupational History  . Not on file.   Social History Main Topics  . Smoking status: Never Smoker  . Smokeless tobacco: Never Used  . Alcohol use Yes     Comment: occasionaly  . Drug use: No  . Sexual activity: Not on file   Other Topics Concern  . Not on file   Social History Narrative  . No narrative on file    Review of Systems: See HPI, otherwise negative ROS  Physical Exam: BP 105/85 (BP Location: Left Arm)   Pulse (!) 109   Temp 97.7 F (36.5 C) (Tympanic)   Resp 20   Ht 5\' 6"  (1.676 m)   Wt 127 kg (280 lb)   SpO2 93%   BMI 45.19 kg/m  General:   Alert,  pleasant and cooperative in NAD Head:  Normocephalic and atraumatic. Neck:  Supple; no masses or thyromegaly. Lungs:  Clear throughout to auscultation.    Heart:  Regular rate and rhythm. Abdomen:  Soft, nontender and nondistended. Normal bowel sounds, without guarding, and without rebound.  Neurologic:  Alert and  oriented x4;  grossly normal neurologically.  Impression/Plan: Natalie Garner is here for an colonoscopy to be performed for screening.  Risks, benefits, limitations, and alternatives regarding  colonoscopy have been reviewed with the patient.  Questions have been answered.  All parties agreeable.   Gaylyn Cheers, MD  10/04/2017, 10:56 AM

## 2017-10-04 NOTE — Transfer of Care (Signed)
Immediate Anesthesia Transfer of Care Note  Patient: Natalie Garner  Procedure(s) Performed: COLONOSCOPY WITH PROPOFOL (N/A )  Patient Location: PACU  Anesthesia Type:General  Level of Consciousness: awake, alert  and oriented  Airway & Oxygen Therapy: Patient Spontanous Breathing and Patient connected to nasal cannula oxygen  Post-op Assessment: Report given to RN and Post -op Vital signs reviewed and stable  Post vital signs: Reviewed and stable  Last Vitals:  Vitals:   10/04/17 1045 10/04/17 1048  BP: 105/85 105/85  Pulse: (!) 107 (!) 109  Resp: 19 20  Temp: 36.5 C 36.5 C  SpO2: 93% 93%    Last Pain:  Vitals:   10/04/17 1048  TempSrc: Tympanic         Complications: No apparent anesthesia complications

## 2017-10-07 LAB — SURGICAL PATHOLOGY

## 2017-10-07 NOTE — Anesthesia Postprocedure Evaluation (Signed)
Anesthesia Post Note  Patient: Natalie Garner  Procedure(s) Performed: COLONOSCOPY WITH PROPOFOL (N/A )  Patient location during evaluation: Endoscopy Anesthesia Type: General Level of consciousness: awake and alert Pain management: pain level controlled Vital Signs Assessment: post-procedure vital signs reviewed and stable Respiratory status: spontaneous breathing, nonlabored ventilation, respiratory function stable and patient connected to nasal cannula oxygen Cardiovascular status: blood pressure returned to baseline and stable Postop Assessment: no apparent nausea or vomiting Anesthetic complications: no     Last Vitals:  Vitals:   10/04/17 1105 10/04/17 1115  BP: 108/85 (!) 124/92  Pulse: 91 84  Resp: 20 10  Temp:    SpO2: 95% 96%    Last Pain:  Vitals:   10/07/17 0740  TempSrc:   PainSc: 0-No pain                 Martha Clan

## 2017-10-08 ENCOUNTER — Encounter: Payer: Self-pay | Admitting: Unknown Physician Specialty

## 2017-12-18 ENCOUNTER — Other Ambulatory Visit: Payer: Self-pay

## 2017-12-18 ENCOUNTER — Ambulatory Visit
Admission: EM | Admit: 2017-12-18 | Discharge: 2017-12-18 | Disposition: A | Discharge status: Home / Self Care | Payer: BC Managed Care – PPO | Attending: Family Medicine | Admitting: Family Medicine

## 2017-12-18 ENCOUNTER — Encounter: Payer: Self-pay | Admitting: Emergency Medicine

## 2017-12-18 DIAGNOSIS — J0101 Acute recurrent maxillary sinusitis: Secondary | ICD-10-CM | POA: Diagnosis not present

## 2017-12-18 MED ORDER — AMOXICILLIN-POT CLAVULANATE 875-125 MG PO TABS: 1.0000 | ORAL_TABLET | Freq: Two times a day (BID) | ORAL | 0 refills | Status: DC

## 2017-12-18 MED ORDER — HYDROCOD POLST-CPM POLST ER 10-8 MG/5ML PO SUER: 5.0000 mL | Freq: Two times a day (BID) | ORAL | 0 refills | Status: DC | PRN

## 2017-12-18 NOTE — ED Provider Notes (Signed)
[facility]    CSN: [id number] Arrival date & time: 12/18/17  1623  History   Chief Complaint Chief Complaint  Patient presents with  . Cough   HPI  57 year old female presents with respiratory symptoms.  Patient states she been sick for 2 weeks.  She states that she was exposed to her grandchildren and subsequently got sick.  She has had sinus pressure and congestion as well as cough and congestion.  Symptoms are severe and worsening.  She has had no improvement with over-the-counter medication.  She states that she has a lot of discolored mucus.  Feels poorly.  She also reports dizziness/vertigo which she feels like he has been contributed by her ongoing respiratory symptoms.  No other associated symptoms.  No other complaints at this time.  Past Medical History:  Diagnosis Date  . Fatty liver   . GERD (gastroesophageal reflux disease)   . High cholesterol   . Hypertension   . Migraine    Past Surgical History:  Procedure Laterality Date  . BREAST BIOPSY Left   . CERVICAL BIOPSY  W/ LOOP ELECTRODE EXCISION    . COLONOSCOPY WITH PROPOFOL N/A 10/04/2017   Procedure: COLONOSCOPY WITH PROPOFOL;  Surgeon: Manya Silvas, MD;  Location: [facility] ENDOSCOPY;  Service: Endoscopy;  Laterality: N/A;  . ENDOMETRIAL BIOPSY    . HEMORRHOID SURGERY     OB History    No data available     Home Medications    Prior to Admission medications   Medication Sig Start Date End Date Taking? Authorizing Provider  atenolol (TENORMIN) 50 MG tablet Take 50 mg by mouth daily.   Yes [provider]  BIOTIN PO Take by mouth.   Yes [provider]  Cholecalciferol 1000 units tablet Take 1,000 Units by mouth daily.   Yes [provider]  hydrochlorothiazide (HYDRODIURIL) 25 MG tablet Take 25 mg by mouth daily.   Yes [provider]  lansoprazole (PREVACID) 30 MG capsule Take 30 mg by mouth daily at 12 noon.   Yes [provider]  LORazepam  (ATIVAN) 1 MG tablet Take 1 mg by mouth every 8 (eight) hours.   Yes [provider]  meloxicam (MOBIC) 7.5 MG tablet Take 7.5 mg by mouth daily.   Yes [provider]  simvastatin (ZOCOR) 40 MG tablet Take 40 mg by mouth daily.   Yes [provider]  amoxicillin-clavulanate (AUGMENTIN) 875-125 MG tablet Take 1 tablet by mouth every 12 (twelve) hours. 12/18/17   Coral Spikes, DO  chlorpheniramine-HYDROcodone (TUSSIONEX PENNKINETIC ER) 10-8 MG/5ML SUER Take 5 mLs by mouth every 12 (twelve) hours as needed. 12/18/17   Coral Spikes, DO  valACYclovir (VALTREX) 500 MG tablet Take 500 mg by mouth 2 (two) times daily.    [provider]    Family History History reviewed. No pertinent family history.  Social History Social History   Tobacco Use  . Smoking status: Never Smoker  . Smokeless tobacco: Never Used  Substance Use Topics  . Alcohol use: Yes    Comment: occasionaly  . Drug use: No   Allergies   Patient has no known allergies.  Review of Systems Review of Systems  Constitutional: Negative.   HENT: Positive for congestion, sinus pressure and sinus pain.   Respiratory: Positive for cough.    Physical Exam Triage Vital Signs ED Triage Vitals  Enc Vitals Group     BP 12/18/17 1646 126/65     Pulse Rate 12/18/17  1646 80     Resp 12/18/17 1646 16     Temp 12/18/17 1646 97.8 F (36.6 C)     Temp Source 12/18/17 1646 Oral     SpO2 12/18/17 1646 98 %     Weight 12/18/17 1643 270 lb (122.5 kg)     Height 12/18/17 1643 5\' 6"  (1.676 m)     Head Circumference --      Peak Flow --      Pain Score 12/18/17 1643 0     Pain Loc --      Pain Edu? --      Excl. in [place]? --    Updated Vital Signs BP 126/65 (BP Location: Left Arm)   Pulse 80   Temp 97.8 F (36.6 C) (Oral)   Resp 16   Ht 5\' 6"  (1.676 m)   Wt 270 lb (122.5 kg)   SpO2 98%   BMI 43.58 kg/m   Physical Exam  Constitutional: She is oriented to person, place, and time. She appears  well-developed. No distress.  HENT:  Head: Normocephalic and atraumatic.  Normal TM's bilaterally. Maxillary sinus tenderness to palpation.  Bilateral.  Severe.   Eyes: Conjunctivae are normal. Right eye exhibits no discharge. Left eye exhibits no discharge.  Cardiovascular: Normal rate and regular rhythm.  Pulmonary/Chest: Effort normal and breath sounds normal.  Neurological: She is alert and oriented to person, place, and time.  Psychiatric: She has a normal mood and affect.  Nursing note and vitals reviewed.  UC Treatments / Results  Labs (all labs ordered are listed, but only abnormal results are displayed) Labs Reviewed - No data to display  EKG  EKG Interpretation None       Radiology No results found.  Procedures Procedures (including critical care time)  Medications Ordered in UC Medications - No data to display   Initial Impression / Assessment and Plan / UC Course  I have reviewed the triage vital signs and the nursing notes.  Pertinent labs & imaging results that were available during my care of the patient were reviewed by me and considered in my medical decision making (see chart for details).     57 year old female presents with signs/symptoms of sinusitis. Treating with Augmentin and tussionex.  Final Clinical Impressions(s) / UC Diagnoses   Final diagnoses:  Acute recurrent maxillary sinusitis    ED Discharge Orders        Ordered    amoxicillin-clavulanate (AUGMENTIN) 875-125 MG tablet  Every 12 hours     12/18/17 1740    chlorpheniramine-HYDROcodone (TUSSIONEX PENNKINETIC ER) 10-8 MG/5ML SUER  Every 12 hours PRN     12/18/17 1740     Controlled Substance Prescriptions [place] Controlled Substance Registry consulted? Not Applicable   Coral Spikes, DO 12/18/17 1752

## 2017-12-18 NOTE — Discharge Instructions (Signed)
Medication as prescribed.  Feel better  Dr. Lacinda Axon

## 2017-12-18 NOTE — ED Triage Notes (Addendum)
Patient c/o cough and chest congestion for over 2 weeks.  Patient states that she would like Augmentin.

## 2017-12-20 NOTE — Telephone Encounter (Signed)
close

## 2019-02-21 ENCOUNTER — Other Ambulatory Visit: Payer: Self-pay

## 2019-02-21 ENCOUNTER — Ambulatory Visit
Admission: EM | Admit: 2019-02-21 | Discharge: 2019-02-21 | Disposition: A | Discharge status: Home / Self Care | Payer: BC Managed Care – PPO | Attending: Family Medicine | Admitting: Family Medicine

## 2019-02-21 DIAGNOSIS — J111 Influenza due to unidentified influenza virus with other respiratory manifestations: Secondary | ICD-10-CM

## 2019-02-21 DIAGNOSIS — J101 Influenza due to other identified influenza virus with other respiratory manifestations: Secondary | ICD-10-CM

## 2019-02-21 LAB — RAPID INFLUENZA A&B ANTIGENS: Influenza A (ARMC): POSITIVE — AB

## 2019-02-21 LAB — RAPID INFLUENZA A&B ANTIGENS (ARMC ONLY): INFLUENZA B (ARMC): NEGATIVE

## 2019-02-21 MED ORDER — OSELTAMIVIR PHOSPHATE 75 MG PO CAPS: 75.0000 mg | ORAL_CAPSULE | Freq: Two times a day (BID) | ORAL | 0 refills | Status: AC

## 2019-02-21 MED ORDER — NAPROXEN 500 MG PO TABS: 500.0000 mg | ORAL_TABLET | Freq: Two times a day (BID) | ORAL | 0 refills | Status: AC | PRN

## 2019-02-21 MED ORDER — HYDROCODONE-HOMATROPINE 5-1.5 MG/5ML PO SYRP: 5.0000 mL | ORAL_SOLUTION | Freq: Four times a day (QID) | ORAL | 0 refills | Status: AC | PRN

## 2019-02-21 NOTE — ED Triage Notes (Addendum)
Pt with fever, chills,headache, bodyaches starting on Thursday

## 2019-02-21 NOTE — ED Provider Notes (Signed)
[facility]    CSN: [id number] Arrival date & time: 02/21/19  4627   History   Chief Complaint Chief Complaint  Patient presents with  . Fever  . Cough   HPI  58 year old female presents with suspected influenza.  Patient reports that she has been sick since Thursday.  She is had fever, chills, headaches, body aches, cough.  Symptoms are very severe.  She reports that her pain is diffuse and very severe.  Cough has led to bilateral rib pain.  She feels extremely poorly.  Her husband has been sick as well.  She is concerned that she has the flu.  No known exacerbating or relieving factors.  No other associated symptoms.  No other complaints.  History reviewed and updated as below.  Past Medical History:  Diagnosis Date  . Fatty liver   . GERD (gastroesophageal reflux disease)   . High cholesterol   . Hypertension   . Migraine    Past Surgical History:  Procedure Laterality Date  . BREAST BIOPSY Left   . CERVICAL BIOPSY  W/ LOOP ELECTRODE EXCISION    . COLONOSCOPY WITH PROPOFOL N/A 10/04/2017   Procedure: COLONOSCOPY WITH PROPOFOL;  Surgeon: Manya Silvas, MD;  Location: [facility] ENDOSCOPY;  Service: Endoscopy;  Laterality: N/A;  . ENDOMETRIAL BIOPSY    . HEMORRHOID SURGERY      OB History   No obstetric history on file.      Home Medications    Prior to Admission medications   Medication Sig Start Date End Date Taking? Authorizing Provider  atenolol (TENORMIN) 50 MG tablet Take 50 mg by mouth daily.    [provider]  BIOTIN PO Take by mouth.    [provider]  Cholecalciferol 1000 units tablet Take 1,000 Units by mouth daily.    [provider]  hydrochlorothiazide (HYDRODIURIL) 25 MG tablet Take 25 mg by mouth daily.    [provider]  HYDROcodone-homatropine (HYCODAN) 5-1.5 MG/5ML syrup Take 5 mLs by mouth every 6 (six) hours as needed. 02/21/19   Coral Spikes, DO  lansoprazole (PREVACID) 30 MG capsule Take 30  mg by mouth daily at 12 noon.    [provider]  naproxen (NAPROSYN) 500 MG tablet Take 1 tablet (500 mg total) by mouth 2 (two) times daily as needed for moderate pain or headache. 02/21/19   Coral Spikes, DO  oseltamivir (TAMIFLU) 75 MG capsule Take 1 capsule (75 mg total) by mouth every 12 (twelve) hours. 02/21/19   Coral Spikes, DO  simvastatin (ZOCOR) 40 MG tablet Take 40 mg by mouth daily.    [provider]  valACYclovir (VALTREX) 500 MG tablet Take 500 mg by mouth 2 (two) times daily.    [provider]   Social History Social History   Tobacco Use  . Smoking status: Never Smoker  . Smokeless tobacco: Never Used  Substance Use Topics  . Alcohol use: Yes    Comment: occasionaly  . Drug use: No     Allergies   Patient has no known allergies.   Review of Systems Review of Systems  Constitutional: Positive for chills and fever.  Respiratory: Positive for cough.   Musculoskeletal:       Body aches.  Neurological: Positive for headaches.   Physical Exam Triage Vital Signs ED Triage Vitals  Enc Vitals Group     BP 02/21/19 0851 134/73     Pulse Rate 02/21/19 0851 97  Resp 02/21/19 0851 18     Temp 02/21/19 0851 98.8 F (37.1 C)     Temp Source 02/21/19 0851 Oral     SpO2 02/21/19 0851 94 %     Weight 02/21/19 0853 295 lb (133.8 kg)     Height 02/21/19 0853 5\' 6"  (1.676 m)     Head Circumference --      Peak Flow --      Pain Score 02/21/19 0853 8     Pain Loc --      Pain Edu? --      Excl. in [place]? --    Updated Vital Signs BP 134/73 (BP Location: Right Arm)   Pulse 97   Temp 98.8 F (37.1 C) (Oral)   Resp 18   Ht 5\' 6"  (1.676 m)   Wt 133.8 kg   SpO2 94%   BMI 47.61 kg/m   Visual Acuity Right Eye Distance:   Left Eye Distance:   Bilateral Distance:    Right Eye Near:   Left Eye Near:    Bilateral Near:     Physical Exam Vitals signs and nursing note reviewed.  Constitutional:      General: She is not in acute  distress.    Appearance: Normal appearance.  HENT:     Head: Normocephalic and atraumatic.     Right Ear: Tympanic membrane normal.     Left Ear: Tympanic membrane normal.     Mouth/Throat:     Pharynx: Oropharynx is clear. No oropharyngeal exudate.  Eyes:     General:        Right eye: No discharge.        Left eye: No discharge.     Conjunctiva/sclera: Conjunctivae normal.  Cardiovascular:     Rate and Rhythm: Normal rate and regular rhythm.  Pulmonary:     Effort: Pulmonary effort is normal.     Breath sounds: Normal breath sounds.  Neurological:     Mental Status: She is alert.  Psychiatric:        Mood and Affect: Mood normal.        Behavior: Behavior normal.    UC Treatments / Results  Labs (all labs ordered are listed, but only abnormal results are displayed) Labs Reviewed  RAPID INFLUENZA A&B ANTIGENS ([facility] ONLY) - Abnormal; Notable for the following components:      Result Value   Influenza A ([facility]) POSITIVE (*)    All other components within normal limits    EKG None  Radiology No results found.  Procedures Procedures (including critical care time)  Medications Ordered in UC Medications - No data to display  Initial Impression / Assessment and Plan / UC Course  I have reviewed the triage vital signs and the nursing notes.  Pertinent labs & imaging results that were available during my care of the patient were reviewed by me and considered in my medical decision making (see chart for details).    58 year old female presents with influenza. Treating with Tamiflu, Hycodan, Naproxen.  Final Clinical Impressions(s) / UC Diagnoses   Final diagnoses:  Influenza   Discharge Instructions   None    ED Prescriptions    Medication Sig Dispense Auth. Provider   oseltamivir (TAMIFLU) 75 MG capsule Take 1 capsule (75 mg total) by mouth every 12 (twelve) hours. 10 capsule Edahi Kroening G, DO   HYDROcodone-homatropine (HYCODAN) 5-1.5 MG/5ML syrup Take 5 mLs  by mouth every 6 (six) hours as needed. 120 mL  Morelia Cassells G, DO   naproxen (NAPROSYN) 500 MG tablet Take 1 tablet (500 mg total) by mouth 2 (two) times daily as needed for moderate pain or headache. 30 tablet Coral Spikes, DO     Controlled Substance Prescriptions [place] Controlled Substance Registry consulted? Not Applicable   Coral Spikes, [place] 02/21/19 0102
